# Patient Record
Sex: Male | Born: 1980 | Race: Black or African American | Hispanic: No | Smoking: Current every day smoker
Health system: Southern US, Community
[De-identification: ages and names within clinical notes are randomized; demographics above are authoritative.]

## PROBLEM LIST (undated history)

## (undated) DIAGNOSIS — R569 Unspecified convulsions: Secondary | ICD-10-CM

## (undated) DIAGNOSIS — I10 Essential (primary) hypertension: Secondary | ICD-10-CM

---

## 2017-02-02 DIAGNOSIS — E119 Type 2 diabetes mellitus without complications: Secondary | ICD-10-CM

## 2017-02-02 HISTORY — DX: Type 2 diabetes mellitus without complications: E11.9

## 2020-06-16 ENCOUNTER — Encounter (HOSPITAL_COMMUNITY): Payer: Self-pay | Admitting: Emergency Medicine

## 2020-06-16 ENCOUNTER — Emergency Department (HOSPITAL_COMMUNITY): Payer: Self-pay

## 2020-06-16 ENCOUNTER — Other Ambulatory Visit: Payer: Self-pay

## 2020-06-16 ENCOUNTER — Emergency Department (HOSPITAL_COMMUNITY)
Admission: EM | Admit: 2020-06-16 | Discharge: 2020-06-17 | Disposition: A | Discharge status: Home / Self Care | Payer: Self-pay | Attending: Emergency Medicine | Admitting: Emergency Medicine

## 2020-06-16 DIAGNOSIS — F17210 Nicotine dependence, cigarettes, uncomplicated: Secondary | ICD-10-CM | POA: Insufficient documentation

## 2020-06-16 DIAGNOSIS — R0789 Other chest pain: Secondary | ICD-10-CM | POA: Insufficient documentation

## 2020-06-16 DIAGNOSIS — R079 Chest pain, unspecified: Secondary | ICD-10-CM

## 2020-06-16 NOTE — ED Triage Notes (Signed)
Patient arrives complaining of left chest pain that began 2 days ago. Patient states it feels like a cramping. No N/V. Denies other symptoms.

## 2020-06-17 LAB — CBC
HCT: 52.4 % — ABNORMAL HIGH (ref 39.0–52.0)
Hemoglobin: 17.6 g/dL — ABNORMAL HIGH (ref 13.0–17.0)
MCH: 30.7 pg (ref 26.0–34.0)
MCHC: 33.6 g/dL (ref 30.0–36.0)
MCV: 91.4 fL (ref 80.0–100.0)
Platelets: 235 10*3/uL (ref 150–400)
RBC: 5.73 MIL/uL (ref 4.22–5.81)
RDW: 13.6 % (ref 11.5–15.5)
WBC: 9.4 10*3/uL (ref 4.0–10.5)
nRBC: 0 % (ref 0.0–0.2)

## 2020-06-17 LAB — BASIC METABOLIC PANEL
Anion gap: 5 (ref 5–15)
BUN: 14 mg/dL (ref 6–20)
CO2: 28 mmol/L (ref 22–32)
Calcium: 9.2 mg/dL (ref 8.9–10.3)
Chloride: 106 mmol/L (ref 98–111)
Creatinine, Ser: 1.15 mg/dL (ref 0.61–1.24)
GFR, Estimated: >60 mL/min (ref >60)
Glucose, Bld: 114 mg/dL — ABNORMAL HIGH (ref 70–99)
Potassium: 4.1 mmol/L (ref 3.5–5.1)
Sodium: 139 mmol/L (ref 135–145)

## 2020-06-17 LAB — D-DIMER, QUANTITATIVE: D-Dimer, Quant: 0.38 ug/mL-FEU (ref 0.00–0.50)

## 2020-06-17 LAB — TROPONIN I (HIGH SENSITIVITY)
Troponin I (High Sensitivity): 2 ng/L (ref <18)
Troponin I (High Sensitivity): 2 ng/L (ref <18)

## 2020-06-17 MED ORDER — IBUPROFEN 800 MG PO TABS: 800.0000 mg | ORAL_TABLET | Freq: Three times a day (TID) | ORAL | 0 refills | Status: DC

## 2020-06-17 MED ORDER — FENTANYL CITRATE (PF) 100 MCG/2ML IJ SOLN
50.0000 ug | Freq: Once | INTRAMUSCULAR | Status: AC
Start: 2020-06-17 — End: 2020-06-17
  Administered 2020-06-17: 50 ug via INTRAVENOUS
  Filled 2020-06-17: qty 2

## 2020-06-17 MED ORDER — METHOCARBAMOL 500 MG PO TABS: 500.0000 mg | ORAL_TABLET | Freq: Three times a day (TID) | ORAL | 0 refills | Status: AC | PRN

## 2020-06-17 NOTE — ED Provider Notes (Signed)
[facility]-EMERGENCY DEPT Provider Note   CSN: [id number] Arrival date & time: 06/16/20  2302     History Chief Complaint  Patient presents with  . Chest Pain    Jonathon Bird is a 40 y.o. male.  Patient presents to the emergency department for evaluation of chest pain.  Experiencing pain for 2 days.  He reports that it feels like there is a cramp in his chest.  Patient reports that the area of pain moves around.  Sometimes its not even in the chest, occasionally he is cramping in his legs or neck.  He is not short of breath but he does feel like pain worsens when he breathes.  No associated nausea, vomiting, diarrhea, fever or cough.        History reviewed. No pertinent past medical history.  There are no problems to display for this patient.   History reviewed. No pertinent surgical history.     No family history on file.  Social History   Tobacco Use  . Smoking status: Current Every Day Smoker    Packs/day: 0.50    Types: Cigarettes  . Smokeless tobacco: Never Used  Substance Use Topics  . Alcohol use: Yes    Alcohol/week: 21.0 standard drinks    Types: 21 Cans of beer per week    Comment: Patient states 3 beers a day, visitor states it is occassional  . Drug use: Not Currently    Home Medications Prior to Admission medications   Medication Sig Start Date End Date Taking? Authorizing Provider  ibuprofen (ADVIL) 800 MG tablet Take 1 tablet (800 mg total) by mouth 3 (three) times daily. 06/17/20  Yes Jeanette Moffatt, Canary Brim, MD  methocarbamol (ROBAXIN) 500 MG tablet Take 1 tablet (500 mg total) by mouth every 8 (eight) hours as needed for muscle spasms. 06/17/20  Yes Davier Tramell, Canary Brim, MD    Allergies    Patient has no known allergies.  Review of Systems   Review of Systems  Cardiovascular: Positive for chest pain.  All other systems reviewed and are negative.   Physical Exam Updated Vital Signs BP 122/79   Pulse 62    Temp 97.6 F (36.4 C) (Oral)   Resp 10   Ht 5\' 7"  (1.702 m)   Wt 81.6 kg   SpO2 100%   BMI 28.19 kg/m   Physical Exam Vitals and nursing note reviewed.  Constitutional:      General: He is not in acute distress.    Appearance: Normal appearance. He is well-developed.  HENT:     Head: Normocephalic and atraumatic.     Right Ear: Hearing normal.     Left Ear: Hearing normal.     Nose: Nose normal.  Eyes:     Conjunctiva/sclera: Conjunctivae normal.     Pupils: Pupils are equal, round, and reactive to light.  Cardiovascular:     Rate and Rhythm: Regular rhythm.     Heart sounds: S1 normal and S2 normal. No murmur heard. No friction rub. No gallop.   Pulmonary:     Effort: Pulmonary effort is normal. No respiratory distress.     Breath sounds: Normal breath sounds.  Chest:     Chest wall: No tenderness.  Abdominal:     General: Bowel sounds are normal.     Palpations: Abdomen is soft.     Tenderness: There is no abdominal tenderness. There is no guarding or rebound. Negative signs include Murphy's sign and McBurney's sign.  Hernia: No hernia is present.  Musculoskeletal:        General: Normal range of motion.     Cervical back: Normal range of motion and neck supple.  Skin:    General: Skin is warm and dry.     Findings: No rash.  Neurological:     Mental Status: He is alert and oriented to person, place, and time.     GCS: GCS eye subscore is 4. GCS verbal subscore is 5. GCS motor subscore is 6.     Cranial Nerves: No cranial nerve deficit.     Sensory: No sensory deficit.     Coordination: Coordination normal.  Psychiatric:        Speech: Speech normal.        Behavior: Behavior normal.        Thought Content: Thought content normal.     ED Results / Procedures / Treatments   Labs (all labs ordered are listed, but only abnormal results are displayed) Labs Reviewed  BASIC METABOLIC PANEL - Abnormal; Notable for the following components:      Result Value    Glucose, Bld 114 (*)    All other components within normal limits  CBC - Abnormal; Notable for the following components:   Hemoglobin 17.6 (*)    HCT 52.4 (*)    All other components within normal limits  D-DIMER, QUANTITATIVE  TROPONIN I (HIGH SENSITIVITY)  TROPONIN I (HIGH SENSITIVITY)    EKG None  Radiology DG Chest Port 1 View  Result Date: 06/17/2020 CLINICAL DATA:  Two days of left chest pain EXAM: PORTABLE CHEST 1 VIEW COMPARISON:  None. FINDINGS: The heart size and mediastinal contours are within normal limits. Perihilar predominant bronchial wall thickening. No focal consolidation. No visible pleural effusion or pneumothorax. The visualized skeletal structures are unremarkable. IMPRESSION: Perihilar predominant bronchial wall thickening which could reflect bronchiolitis or reactive airways disease. No focal consolidation. Electronically Signed   By: Maudry Mayhew MD   On: 06/17/2020 02:13    Procedures Procedures   Medications Ordered in ED Medications  fentaNYL (SUBLIMAZE) injection 50 mcg (50 mcg Intravenous Given 06/17/20 4098)    ED Course  I have reviewed the triage vital signs and the nursing notes.  Pertinent labs & imaging results that were available during my care of the patient were reviewed by me and considered in my medical decision making (see chart for details).    MDM Rules/Calculators/A&P                          Patient presents to the emergency department for evaluation of chest pain.  Symptoms ongoing for a couple of days.  He reports that as feeling like a cramp or a spasm.  The area of pain moves around to different areas of the chest and sometimes other parts of the body.  Work-up has been very reassuring.  Symptoms are very atypical for cardiac etiology.  Troponins are negative.  D-dimer is negative.  As he is low risk for PE per Wells criteria, no further work-up needed.  He is also felt to be very low risk for cardiac etiology and does not  require inpatient work-up.  Treat for likely musculoskeletal pain.  Final Clinical Impression(s) / ED Diagnoses Final diagnoses:  Atypical chest pain    Rx / DC Orders ED Discharge Orders         Ordered    ibuprofen (ADVIL) 800 MG tablet  3 times daily        06/17/20 0424    methocarbamol (ROBAXIN) 500 MG tablet  Every 8 hours PRN        06/17/20 0424           Gilda Crease, MD 06/17/20 [phone]

## 2020-12-29 ENCOUNTER — Encounter (HOSPITAL_COMMUNITY): Payer: Self-pay

## 2020-12-29 ENCOUNTER — Emergency Department (HOSPITAL_COMMUNITY): Payer: No Typology Code available for payment source

## 2020-12-29 ENCOUNTER — Inpatient Hospital Stay (HOSPITAL_COMMUNITY)
Admission: EM | Admit: 2020-12-29 | Discharge: 2021-01-01 | DRG: 101 | Disposition: A | Discharge status: Home / Self Care | Payer: No Typology Code available for payment source | Attending: Surgery | Admitting: Surgery

## 2020-12-29 ENCOUNTER — Other Ambulatory Visit: Payer: Self-pay

## 2020-12-29 DIAGNOSIS — G40409 Other generalized epilepsy and epileptic syndromes, not intractable, without status epilepticus: Secondary | ICD-10-CM | POA: Diagnosis present

## 2020-12-29 DIAGNOSIS — Z820 Family history of epilepsy and other diseases of the nervous system: Secondary | ICD-10-CM | POA: Diagnosis not present

## 2020-12-29 DIAGNOSIS — Y92410 Unspecified street and highway as the place of occurrence of the external cause: Secondary | ICD-10-CM | POA: Diagnosis not present

## 2020-12-29 DIAGNOSIS — R0789 Other chest pain: Secondary | ICD-10-CM | POA: Diagnosis not present

## 2020-12-29 DIAGNOSIS — F17210 Nicotine dependence, cigarettes, uncomplicated: Secondary | ICD-10-CM | POA: Diagnosis present

## 2020-12-29 DIAGNOSIS — E860 Dehydration: Secondary | ICD-10-CM | POA: Diagnosis present

## 2020-12-29 DIAGNOSIS — M545 Low back pain, unspecified: Secondary | ICD-10-CM | POA: Diagnosis present

## 2020-12-29 DIAGNOSIS — S60812A Abrasion of left wrist, initial encounter: Secondary | ICD-10-CM | POA: Diagnosis present

## 2020-12-29 DIAGNOSIS — K5900 Constipation, unspecified: Secondary | ICD-10-CM | POA: Diagnosis not present

## 2020-12-29 DIAGNOSIS — R451 Restlessness and agitation: Secondary | ICD-10-CM | POA: Diagnosis present

## 2020-12-29 DIAGNOSIS — R4182 Altered mental status, unspecified: Secondary | ICD-10-CM | POA: Diagnosis present

## 2020-12-29 DIAGNOSIS — F10120 Alcohol abuse with intoxication, uncomplicated: Secondary | ICD-10-CM | POA: Diagnosis present

## 2020-12-29 DIAGNOSIS — E119 Type 2 diabetes mellitus without complications: Secondary | ICD-10-CM | POA: Diagnosis present

## 2020-12-29 DIAGNOSIS — S022XXA Fracture of nasal bones, initial encounter for closed fracture: Secondary | ICD-10-CM | POA: Diagnosis present

## 2020-12-29 DIAGNOSIS — Z20822 Contact with and (suspected) exposure to covid-19: Secondary | ICD-10-CM | POA: Diagnosis present

## 2020-12-29 DIAGNOSIS — S90511A Abrasion, right ankle, initial encounter: Secondary | ICD-10-CM | POA: Diagnosis present

## 2020-12-29 DIAGNOSIS — Y906 Blood alcohol level of 120-199 mg/100 ml: Secondary | ICD-10-CM | POA: Diagnosis present

## 2020-12-29 DIAGNOSIS — S060XAA Concussion with loss of consciousness status unknown, initial encounter: Secondary | ICD-10-CM | POA: Diagnosis present

## 2020-12-29 DIAGNOSIS — M542 Cervicalgia: Secondary | ICD-10-CM | POA: Diagnosis present

## 2020-12-29 DIAGNOSIS — R569 Unspecified convulsions: Secondary | ICD-10-CM | POA: Diagnosis present

## 2020-12-29 DIAGNOSIS — T1490XA Injury, unspecified, initial encounter: Secondary | ICD-10-CM

## 2020-12-29 LAB — COMPREHENSIVE METABOLIC PANEL
ALT: 15 U/L (ref 0–44)
AST: 19 U/L (ref 15–41)
Albumin: 3.8 g/dL (ref 3.5–5.0)
Alkaline Phosphatase: 43 U/L (ref 38–126)
Anion gap: 8 (ref 5–15)
BUN: 8 mg/dL (ref 6–20)
CO2: 24 mmol/L (ref 22–32)
Calcium: 8.5 mg/dL — ABNORMAL LOW (ref 8.9–10.3)
Chloride: 106 mmol/L (ref 98–111)
Creatinine, Ser: 1.04 mg/dL (ref 0.61–1.24)
GFR, Estimated: >60 mL/min (ref >60)
Glucose, Bld: 92 mg/dL (ref 70–99)
Potassium: 3.7 mmol/L (ref 3.5–5.1)
Sodium: 138 mmol/L (ref 135–145)
Total Bilirubin: 0.3 mg/dL (ref 0.3–1.2)
Total Protein: 6.9 g/dL (ref 6.5–8.1)

## 2020-12-29 LAB — I-STAT ARTERIAL BLOOD GAS, ED
Acid-base deficit: 4 mmol/L — ABNORMAL HIGH (ref 0.0–2.0)
Bicarbonate: 24.1 mmol/L (ref 20.0–28.0)
Calcium, Ion: 1.15 mmol/L (ref 1.15–1.40)
HCT: 46 % (ref 39.0–52.0)
Hemoglobin: 15.6 g/dL (ref 13.0–17.0)
O2 Saturation: 100 %
Patient temperature: 98.6
Potassium: 3.6 mmol/L (ref 3.5–5.1)
Sodium: 141 mmol/L (ref 135–145)
TCO2: 26 mmol/L (ref 22–32)
pCO2 arterial: 52.7 mmHg — ABNORMAL HIGH (ref 32.0–48.0)
pH, Arterial: 7.268 — ABNORMAL LOW (ref 7.350–7.450)
pO2, Arterial: 488 mmHg — ABNORMAL HIGH (ref 83.0–108.0)

## 2020-12-29 LAB — PROTIME-INR
INR: 1 (ref 0.8–1.2)
Prothrombin Time: 13.6 seconds (ref 11.4–15.2)

## 2020-12-29 LAB — BASIC METABOLIC PANEL
Anion gap: 11 (ref 5–15)
BUN: 8 mg/dL (ref 6–20)
CO2: 21 mmol/L — ABNORMAL LOW (ref 22–32)
Calcium: 8.1 mg/dL — ABNORMAL LOW (ref 8.9–10.3)
Chloride: 104 mmol/L (ref 98–111)
Creatinine, Ser: 0.8 mg/dL (ref 0.61–1.24)
GFR, Estimated: >60 mL/min (ref >60)
Glucose, Bld: 95 mg/dL (ref 70–99)
Potassium: 3.5 mmol/L (ref 3.5–5.1)
Sodium: 136 mmol/L (ref 135–145)

## 2020-12-29 LAB — LACTIC ACID, PLASMA
Lactic Acid, Venous: 3.1 mmol/L (ref 0.5–1.9)
Lactic Acid, Venous: 2.3 mmol/L (ref 0.5–1.9)

## 2020-12-29 LAB — HIV ANTIBODY (ROUTINE TESTING W REFLEX): HIV Screen 4th Generation wRfx: NONREACTIVE

## 2020-12-29 LAB — CBC
HCT: 47.6 % (ref 39.0–52.0)
Hemoglobin: 15.6 g/dL (ref 13.0–17.0)
MCH: 30.5 pg (ref 26.0–34.0)
MCHC: 32.8 g/dL (ref 30.0–36.0)
MCV: 93 fL (ref 80.0–100.0)
Platelets: 270 10*3/uL (ref 150–400)
RBC: 5.12 MIL/uL (ref 4.22–5.81)
RDW: 13.6 % (ref 11.5–15.5)
WBC: 8.5 10*3/uL (ref 4.0–10.5)
nRBC: 0 % (ref 0.0–0.2)

## 2020-12-29 LAB — I-STAT CHEM 8, ED
BUN: 9 mg/dL (ref 6–20)
Calcium, Ion: 1.13 mmol/L — ABNORMAL LOW (ref 1.15–1.40)
Chloride: 105 mmol/L (ref 98–111)
Creatinine, Ser: 1.2 mg/dL (ref 0.61–1.24)
Glucose, Bld: 86 mg/dL (ref 70–99)
HCT: 49 % (ref 39.0–52.0)
Hemoglobin: 16.7 g/dL (ref 13.0–17.0)
Potassium: 3.7 mmol/L (ref 3.5–5.1)
Sodium: 141 mmol/L (ref 135–145)
TCO2: 24 mmol/L (ref 22–32)

## 2020-12-29 LAB — GLUCOSE, CAPILLARY: Glucose-Capillary: 163 mg/dL — ABNORMAL HIGH (ref 70–99)

## 2020-12-29 LAB — ETHANOL: Alcohol, Ethyl (B): 143 mg/dL — ABNORMAL HIGH (ref <10)

## 2020-12-29 LAB — SAMPLE TO BLOOD BANK

## 2020-12-29 LAB — RESP PANEL BY RT-PCR (FLU A&B, COVID) ARPGX2
Influenza A by PCR: NEGATIVE
Influenza B by PCR: NEGATIVE
SARS Coronavirus 2 by RT PCR: NEGATIVE

## 2020-12-29 MED ORDER — LORAZEPAM 2 MG/ML IJ SOLN
INTRAMUSCULAR | Status: AC
  Filled 2020-12-29: qty 4

## 2020-12-29 MED ORDER — INSULIN ASPART 100 UNIT/ML IJ SOLN: 0.0000 [IU] | Freq: Every day | INTRAMUSCULAR | Status: DC

## 2020-12-29 MED ORDER — PROPOFOL 1000 MG/100ML IV EMUL: 5.0000 ug/kg/min | INTRAVENOUS | Status: DC

## 2020-12-29 MED ORDER — ONDANSETRON HCL 4 MG PO TABS: 4.0000 mg | ORAL_TABLET | Freq: Four times a day (QID) | ORAL | Status: DC | PRN

## 2020-12-29 MED ORDER — LEVETIRACETAM IN NACL 500 MG/100ML IV SOLN
500.0000 mg | Freq: Two times a day (BID) | INTRAVENOUS | Status: DC
  Administered 2020-12-29 – 2020-12-30 (×3): 500 mg via INTRAVENOUS
  Filled 2020-12-29 (×4): qty 100

## 2020-12-29 MED ORDER — PROPOFOL 1000 MG/100ML IV EMUL: 0.0000 ug/kg/min | INTRAVENOUS | Status: DC

## 2020-12-29 MED ORDER — IOPAMIDOL (ISOVUE-370) INJECTION 76%
75.0000 mL | Freq: Once | INTRAVENOUS | Status: AC | PRN
  Administered 2020-12-29: 75 mL via INTRAVENOUS

## 2020-12-29 MED ORDER — SIMETHICONE 80 MG PO CHEW: 80.0000 mg | CHEWABLE_TABLET | Freq: Four times a day (QID) | ORAL | Status: DC | PRN

## 2020-12-29 MED ORDER — DEXTROSE-NACL 5-0.45 % IV SOLN: INTRAVENOUS | Status: DC

## 2020-12-29 MED ORDER — ONDANSETRON HCL 4 MG/2ML IJ SOLN
4.0000 mg | Freq: Four times a day (QID) | INTRAMUSCULAR | Status: DC | PRN
  Administered 2020-12-29: 4 mg via INTRAVENOUS
  Filled 2020-12-29: qty 2

## 2020-12-29 MED ORDER — KETOROLAC TROMETHAMINE 15 MG/ML IJ SOLN
15.0000 mg | Freq: Three times a day (TID) | INTRAMUSCULAR | Status: DC
  Administered 2020-12-29 – 2021-01-01 (×8): 15 mg via INTRAVENOUS
  Filled 2020-12-29 (×11): qty 1

## 2020-12-29 MED ORDER — DOCUSATE SODIUM 100 MG PO CAPS
100.0000 mg | ORAL_CAPSULE | Freq: Two times a day (BID) | ORAL | Status: DC
  Administered 2020-12-29 – 2020-12-31 (×5): 100 mg via ORAL
  Filled 2020-12-29 (×8): qty 1

## 2020-12-29 MED ORDER — DOCUSATE SODIUM 50 MG/5ML PO LIQD: 100.0000 mg | Freq: Two times a day (BID) | ORAL | Status: DC

## 2020-12-29 MED ORDER — ONDANSETRON HCL 4 MG/2ML IJ SOLN: 4.0000 mg | Freq: Four times a day (QID) | INTRAMUSCULAR | Status: DC | PRN

## 2020-12-29 MED ORDER — PROCHLORPERAZINE EDISYLATE 10 MG/2ML IJ SOLN: 10.0000 mg | INTRAMUSCULAR | Status: DC | PRN

## 2020-12-29 MED ORDER — ENOXAPARIN SODIUM 30 MG/0.3ML IJ SOSY
30.0000 mg | PREFILLED_SYRINGE | Freq: Two times a day (BID) | INTRAMUSCULAR | Status: DC
  Administered 2020-12-30 – 2020-12-31 (×4): 30 mg via SUBCUTANEOUS
  Filled 2020-12-29 (×6): qty 0.3

## 2020-12-29 MED ORDER — ACETAMINOPHEN 325 MG PO TABS
650.0000 mg | ORAL_TABLET | Freq: Four times a day (QID) | ORAL | Status: DC
  Administered 2020-12-29 – 2021-01-01 (×12): 650 mg via ORAL
  Filled 2020-12-29 (×13): qty 2

## 2020-12-29 MED ORDER — INSULIN ASPART 100 UNIT/ML IJ SOLN: 0.0000 [IU] | Freq: Three times a day (TID) | INTRAMUSCULAR | Status: DC

## 2020-12-29 MED ORDER — POLYETHYLENE GLYCOL 3350 17 G PO PACK
17.0000 g | PACK | Freq: Every day | ORAL | Status: DC
  Filled 2020-12-29: qty 1

## 2020-12-29 MED ORDER — OXYCODONE HCL 5 MG/5ML PO SOLN: 5.0000 mg | ORAL | Status: DC | PRN

## 2020-12-29 MED ORDER — INFLUENZA VAC SPLIT QUAD 0.5 ML IM SUSY
0.5000 mL | PREFILLED_SYRINGE | INTRAMUSCULAR | Status: DC
  Filled 2020-12-29 (×2): qty 0.5

## 2020-12-29 MED ORDER — FENTANYL 2500MCG IN NS 250ML (10MCG/ML) PREMIX INFUSION
50.0000 ug/h | INTRAVENOUS | Status: DC
Start: 2020-12-29 — End: 2020-12-29
  Administered 2020-12-29: 50 ug/h via INTRAVENOUS
  Filled 2020-12-29: qty 250

## 2020-12-29 MED ORDER — ACETAMINOPHEN 160 MG/5ML PO SOLN: 650.0000 mg | ORAL | Status: DC | PRN

## 2020-12-29 MED ORDER — MORPHINE SULFATE (PF) 2 MG/ML IV SOLN
2.0000 mg | INTRAVENOUS | Status: DC | PRN
  Administered 2020-12-29 (×3): 2 mg via INTRAVENOUS
  Filled 2020-12-29: qty 2
  Filled 2020-12-29 (×2): qty 1

## 2020-12-29 MED ORDER — METOPROLOL TARTRATE 5 MG/5ML IV SOLN: 5.0000 mg | Freq: Four times a day (QID) | INTRAVENOUS | Status: DC | PRN

## 2020-12-29 MED ORDER — FENTANYL CITRATE PF 50 MCG/ML IJ SOSY
50.0000 ug | PREFILLED_SYRINGE | Freq: Once | INTRAMUSCULAR | Status: AC
  Administered 2020-12-29: 50 ug via INTRAVENOUS

## 2020-12-29 MED ORDER — FENTANYL BOLUS VIA INFUSION
50.0000 ug | INTRAVENOUS | Status: DC | PRN
  Filled 2020-12-29: qty 100

## 2020-12-29 MED ORDER — PROPOFOL 1000 MG/100ML IV EMUL
INTRAVENOUS | Status: AC
  Administered 2020-12-29: 14 ug/kg/min via INTRAVENOUS
  Filled 2020-12-29: qty 100

## 2020-12-29 NOTE — ED Notes (Signed)
Prop drip hung 46mcg/min by autumn rn
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
## 2020-12-29 NOTE — ED Notes (Signed)
Ewtomidate  20 mg   roc  100mg  iv per autumn rn

## 2020-12-29 NOTE — ED Notes (Signed)
Bp 110/90 man
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
## 2020-12-29 NOTE — ED Notes (Signed)
All sedation stopped. Plan to extubate due to pt breathing around tube and copious secretions. Pt able to follow commands

## 2020-12-29 NOTE — Progress Notes (Signed)
Inpatient Rehab Admissions Coordinator:   Per therapy recommendations, patient was screened for CIR candidacy by Megan Salon, MS, CCC-SLP. At this time, Pt. does not appear to demonstrate medical necessity to justify in hospital rehabilitation/CIR. will not pursue a rehab consult for this Pt. I have reviewed case with rehab MD and she is in agreement.   Recommend other rehab venues to be pursued.  Please contact me with any questions.   Megan Salon, MS, CCC-SLP Rehab Admissions Coordinator  [phone] (celll) [phone] (office)

## 2020-12-29 NOTE — ED Notes (Signed)
Pt returned from ct

## 2020-12-29 NOTE — ED Notes (Signed)
Visitors at bedside.

## 2020-12-29 NOTE — Progress Notes (Signed)
RT Note: ETT retracted 3 cm per CXR and MD. Tube secured at 25@ lip

## 2020-12-29 NOTE — Progress Notes (Signed)
Transported to CT and back to TRAB w/o complication. RT will cont to monitor.
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
## 2020-12-29 NOTE — ED Notes (Signed)
Autumn rn to c-t with the pt

## 2020-12-29 NOTE — ED Notes (Signed)
Dr. Pilar Plate called to bedside. Verbal order for soft restraints placed

## 2020-12-29 NOTE — ED Notes (Signed)
[place] j c-collar placed by autumn rn  number 18 og

## 2020-12-29 NOTE — Progress Notes (Signed)
Inpatient Rehab Admissions Coordinator:   Per therapy recommendations,  patient was screened for CIR candidacy by Megan Salon, MS, CCC-SLP. I have requested MD review case for medical necessity for CIR admission and will update chart when I receive a response.   Megan Salon, MS, CCC-SLP Rehab Admissions Coordinator  [phone] (celll) [phone] (office)

## 2020-12-29 NOTE — Progress Notes (Signed)
Pt. Tube appeared to be malfunctioning. MD was notified and responded to pt. Room. MD stated to extubate pt. Pt. Was extubated and is tolerating well on a nasal cannula. No issues at this time.
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
## 2020-12-29 NOTE — ED Notes (Signed)
Height 71 inches wt 83.9kg

## 2020-12-29 NOTE — ED Notes (Signed)
To ct
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
## 2020-12-29 NOTE — ED Notes (Signed)
Intubated by dr Pilar Plate  25 at   the  teeth
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
## 2020-12-29 NOTE — ED Notes (Signed)
Receiving RN updated on pt extubation

## 2020-12-30 ENCOUNTER — Inpatient Hospital Stay (HOSPITAL_COMMUNITY): Payer: No Typology Code available for payment source

## 2020-12-30 DIAGNOSIS — R569 Unspecified convulsions: Secondary | ICD-10-CM

## 2020-12-30 LAB — BLOOD GAS, ARTERIAL
Acid-Base Excess: 1.1 mmol/L (ref 0.0–2.0)
Bicarbonate: 25.3 mmol/L (ref 20.0–28.0)
Drawn by: 56037
FIO2: 28
O2 Saturation: 98.5 %
Patient temperature: 37
pCO2 arterial: 40.6 mmHg (ref 32.0–48.0)
pH, Arterial: 7.411 (ref 7.350–7.450)
pO2, Arterial: 118 mmHg — ABNORMAL HIGH (ref 83.0–108.0)

## 2020-12-30 LAB — HEMOGLOBIN A1C
Hgb A1c MFr Bld: 5.4 % (ref 4.8–5.6)
Mean Plasma Glucose: 108 mg/dL

## 2020-12-30 LAB — GLUCOSE, CAPILLARY
Glucose-Capillary: 91 mg/dL (ref 70–99)
Glucose-Capillary: 112 mg/dL — ABNORMAL HIGH (ref 70–99)

## 2020-12-30 LAB — TRIGLYCERIDES: Triglycerides: 54 mg/dL (ref <150)

## 2020-12-30 MED ORDER — PRAZOSIN HCL 1 MG PO CAPS
1.0000 mg | ORAL_CAPSULE | Freq: Every day | ORAL | Status: DC
  Administered 2020-12-31: 1 mg via ORAL
  Filled 2020-12-30 (×2): qty 1

## 2020-12-30 MED ORDER — VALPROATE SODIUM 100 MG/ML IV SOLN
1000.0000 mg | Freq: Once | INTRAVENOUS | Status: AC
  Administered 2020-12-31: 1000 mg via INTRAVENOUS
  Filled 2020-12-30: qty 10

## 2020-12-30 MED ORDER — POLYETHYLENE GLYCOL 3350 17 G PO PACK
17.0000 g | PACK | Freq: Every day | ORAL | Status: DC
  Administered 2020-12-30: 17 g via ORAL
  Filled 2020-12-30 (×2): qty 1

## 2020-12-30 MED ORDER — PRAZOSIN HCL 2 MG PO CAPS: 3.0000 mg | ORAL_CAPSULE | Freq: Every evening | ORAL | Status: DC | PRN

## 2020-12-30 MED ORDER — OXYCODONE HCL 5 MG PO TABS
5.0000 mg | ORAL_TABLET | Freq: Four times a day (QID) | ORAL | Status: DC | PRN
  Administered 2020-12-30 – 2020-12-31 (×2): 5 mg via ORAL
  Filled 2020-12-30 (×3): qty 1

## 2020-12-30 MED ORDER — DIVALPROEX SODIUM 500 MG PO DR TAB
500.0000 mg | DELAYED_RELEASE_TABLET | Freq: Two times a day (BID) | ORAL | Status: DC
  Administered 2020-12-31 – 2021-01-01 (×3): 500 mg via ORAL
  Filled 2020-12-30 (×4): qty 1

## 2020-12-30 MED ORDER — HYDROXYZINE HCL 10 MG PO TABS
10.0000 mg | ORAL_TABLET | Freq: Three times a day (TID) | ORAL | Status: DC | PRN
  Administered 2020-12-30: 10 mg via ORAL
  Filled 2020-12-30 (×2): qty 1

## 2020-12-30 MED ORDER — GABAPENTIN 300 MG PO CAPS
300.0000 mg | ORAL_CAPSULE | Freq: Three times a day (TID) | ORAL | Status: DC
  Administered 2020-12-30 – 2021-01-01 (×6): 300 mg via ORAL
  Filled 2020-12-30 (×8): qty 1

## 2020-12-30 MED ORDER — LEVETIRACETAM 500 MG PO TABS
500.0000 mg | ORAL_TABLET | Freq: Two times a day (BID) | ORAL | Status: DC
  Administered 2020-12-30: 500 mg via ORAL
  Filled 2020-12-30 (×3): qty 1

## 2020-12-30 MED ORDER — METHOCARBAMOL 500 MG PO TABS
500.0000 mg | ORAL_TABLET | Freq: Three times a day (TID) | ORAL | Status: DC
  Administered 2020-12-30 – 2021-01-01 (×7): 500 mg via ORAL
  Filled 2020-12-30 (×8): qty 1

## 2020-12-30 MED ORDER — NICOTINE 14 MG/24HR TD PT24
14.0000 mg | MEDICATED_PATCH | Freq: Every day | TRANSDERMAL | Status: DC
  Administered 2020-12-30 – 2020-12-31 (×2): 14 mg via TRANSDERMAL
  Filled 2020-12-30 (×3): qty 1

## 2020-12-30 MED ORDER — LEVETIRACETAM IN NACL 500 MG/100ML IV SOLN
500.0000 mg | Freq: Two times a day (BID) | INTRAVENOUS | Status: DC
  Administered 2020-12-30: 500 mg via INTRAVENOUS
  Filled 2020-12-30: qty 100

## 2020-12-30 NOTE — Progress Notes (Signed)
Notified of witnessed sz-like activity while bedside nurse present and with patient simultaneously reporting chest pain. Episode spontaneously resolved without medication. Vitals remained normal. EKG obtained, NSR. Lytes, CBC, ABG ordered. Reported, unconfirmed, h/o sz, not on medication, reports last sz 57m ago. On keppra since admission, changed to IV dose given this episode. With verbal communication by the patient during what is being reported to me as a seizure, my suspicion for organic etiology is low, however with nasal bone fx and reported sz in the field that resulted in the patient getting intubated, I did consult Neurology, Dr. Wilford Corner, who will plan to obtain EEG. Will f/u on labs.  Diamantina Monks, MD General and Trauma Surgery [facility] Surgery
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
## 2020-12-30 NOTE — Significant Event (Signed)
Rapid Response Event Note   Reason for Call : CP post seizure Initial Focused Assessment:  Nursing notified me of patient having a short seizure (1 min) and then c/o CP after seizure. I did not witness seizure activity. Pt arousable, but "sleepy" since seizure. Vague CP symptoms. EKG ordered to r/o STE. 12 lead NSR with no STE. Pt on Keppra for seizures already.   2120-98.29F, HR 65, 133/91, RR 18 with sats 100% on 2L [place]   Interventions:  -12 lead EKG   Dr. Bedelia Person was notified by primary RN and orders to follow.    MD Notified: Per Primary RN Call Time: 2125 Arrival Time: 2130 End Time: 2139  Rose Fillers, RN

## 2020-12-30 NOTE — Discharge Instructions (Addendum)
SEIZURE PRECAUTIONS °Per [place] DMV statutes, patients with seizures are not allowed to drive until they have been seizure-free for six months. °  °Use caution when using heavy equipment or power tools. Avoid working on ladders or at heights. Take showers instead of baths. Ensure the water temperature is not too high on the home water heater. Do not go swimming alone. Do not lock yourself in a room alone (i.e. bathroom). When caring for infants or small children, sit down when holding, feeding, or changing them to minimize risk of injury to the child in the event you have a seizure. Maintain good sleep hygiene. Avoid alcohol. °  °If patient has another seizure, call 911 and bring them back to the ED if: °A.  The seizure lasts longer than 5 minutes.      °B.  The patient doesn't wake shortly after the seizure or has new problems such as difficulty seeing, speaking or moving following the seizure °C.  The patient was injured during the seizure °D.  The patient has a temperature over 102 F (39C) °E.  The patient vomited during the seizure and now is having trouble breathing ° ° °

## 2020-12-30 NOTE — Progress Notes (Signed)
Mobility Specialist Progress Note   12/30/20 1826  Mobility  Activity Ambulated in hall  Level of Assistance Contact guard assist, steadying assist  Assistive Device Front wheel walker  Distance Ambulated (ft) 175 ft  Mobility Ambulated with assistance in hallway;Ambulated with assistance in room  Mobility Response Tolerated well  Mobility performed by Mobility specialist  $Mobility charge 1 Mobility   Received pt EOB c/o tightness in LE but no pain and agreeable to mobility. Pt tends to walk w/ locked legs(no knee extension) but had no complaint of pain. X1 seated break d/t fatigue but recovered quickly. While seated pt worked on some passive ROM in LE. Returned back to room where pt practice ambulating w/o AD which they tolerated well for ~10 ft w/ no LOB. Left w/ pt on EOB w/ call bell by side and significant other in room.      Frederico Hamman Mobility Specialist Phone Number [phone]
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
## 2020-12-31 ENCOUNTER — Inpatient Hospital Stay (HOSPITAL_COMMUNITY): Payer: No Typology Code available for payment source

## 2020-12-31 LAB — BASIC METABOLIC PANEL
Anion gap: 5 (ref 5–15)
BUN: 12 mg/dL (ref 6–20)
CO2: 26 mmol/L (ref 22–32)
Calcium: 8.3 mg/dL — ABNORMAL LOW (ref 8.9–10.3)
Chloride: 106 mmol/L (ref 98–111)
Creatinine, Ser: 1.06 mg/dL (ref 0.61–1.24)
GFR, Estimated: >60 mL/min (ref >60)
Glucose, Bld: 91 mg/dL (ref 70–99)
Potassium: 3.7 mmol/L (ref 3.5–5.1)
Sodium: 137 mmol/L (ref 135–145)

## 2020-12-31 LAB — MAGNESIUM: Magnesium: 2.1 mg/dL (ref 1.7–2.4)

## 2020-12-31 LAB — PHOSPHORUS: Phosphorus: 3.5 mg/dL (ref 2.5–4.6)

## 2020-12-31 LAB — CBC
HCT: 42.9 % (ref 39.0–52.0)
Hemoglobin: 14.5 g/dL (ref 13.0–17.0)
MCH: 30.8 pg (ref 26.0–34.0)
MCHC: 33.8 g/dL (ref 30.0–36.0)
MCV: 91.1 fL (ref 80.0–100.0)
Platelets: 251 10*3/uL (ref 150–400)
RBC: 4.71 MIL/uL (ref 4.22–5.81)
RDW: 13.4 % (ref 11.5–15.5)
WBC: 9.9 10*3/uL (ref 4.0–10.5)
nRBC: 0 % (ref 0.0–0.2)

## 2020-12-31 LAB — GLUCOSE, CAPILLARY: Glucose-Capillary: 139 mg/dL — ABNORMAL HIGH (ref 70–99)

## 2020-12-31 MED ORDER — MAGNESIUM HYDROXIDE 400 MG/5ML PO SUSP
30.0000 mL | Freq: Once | ORAL | Status: AC
  Administered 2020-12-31: 30 mL via ORAL

## 2020-12-31 MED ORDER — MAGNESIUM HYDROXIDE 400 MG/5ML PO SUSP
ORAL | Status: AC
  Filled 2020-12-31: qty 30

## 2020-12-31 MED ORDER — GADOBUTROL 1 MMOL/ML IV SOLN
8.0000 mL | Freq: Once | INTRAVENOUS | Status: AC | PRN
  Administered 2020-12-31: 8 mL via INTRAVENOUS

## 2020-12-31 MED ORDER — POLYETHYLENE GLYCOL 3350 17 G PO PACK
17.0000 g | PACK | Freq: Two times a day (BID) | ORAL | Status: DC
  Administered 2020-12-31: 17 g via ORAL
  Filled 2020-12-31: qty 1
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
## 2020-12-31 NOTE — Progress Notes (Signed)
   12/31/20 1615  Clinical Encounter Type  Visited With Patient and family together  Visit Type Follow-up  Referral From Chaplain  Consult/Referral To Chaplain   Chaplain Tery Sanfilippo visited the patient as his girlfriend and cousin were at his bedside. Donnajean Lopes delivered a Bible as requested. This note was prepared by Deneen Harts, M.Div..  For questions please contact by phone [phone].

## 2020-12-31 NOTE — Progress Notes (Signed)
EEG completed, results pending. 

## 2020-12-31 NOTE — Progress Notes (Signed)
Mobility Specialist Progress Note   12/31/20 1832  Mobility  Activity Dangled on edge of bed  Level of Assistance Standby assist, set-up cues, supervision of patient - no hands on  Assistive Device None  Mobility Response Tolerated well  Mobility performed by Mobility specialist  $Mobility charge 1 Mobility   Received pt sitting EOB c/o being tired but agreeable to mobility. Worked on LE exercises, pt able to cooperate in all task w/o any problem. Left on EOB w/ call bell in reach and wife in room.  Frederico Hamman Mobility Specialist Phone Number [phone]
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
## 2021-01-01 ENCOUNTER — Encounter (HOSPITAL_COMMUNITY): Payer: Self-pay | Admitting: Emergency Medicine

## 2021-01-01 ENCOUNTER — Other Ambulatory Visit (HOSPITAL_COMMUNITY): Payer: Self-pay

## 2021-01-01 LAB — GLUCOSE, CAPILLARY: Glucose-Capillary: 127 mg/dL — ABNORMAL HIGH (ref 70–99)

## 2021-01-01 MED ORDER — POLYETHYLENE GLYCOL 3350 17 G PO PACK: 17.0000 g | PACK | Freq: Every day | ORAL | Status: AC | PRN

## 2021-01-01 MED ORDER — OXYCODONE HCL 5 MG PO TABS
5.0000 mg | ORAL_TABLET | Freq: Four times a day (QID) | ORAL | 0 refills | Status: DC | PRN
  Filled 2021-01-01: qty 15, 4d supply, fill #0

## 2021-01-01 MED ORDER — DIVALPROEX SODIUM 500 MG PO DR TAB
500.0000 mg | DELAYED_RELEASE_TABLET | Freq: Two times a day (BID) | ORAL | 0 refills | Status: AC
  Filled 2021-01-01: qty 60, 30d supply, fill #0

## 2021-01-01 MED ORDER — PRAZOSIN HCL 1 MG PO CAPS
1.0000 mg | ORAL_CAPSULE | Freq: Every day | ORAL | 0 refills | Status: AC
  Filled 2021-01-01: qty 30, 30d supply, fill #0

## 2021-01-01 MED ORDER — METHOCARBAMOL 500 MG PO TABS
500.0000 mg | ORAL_TABLET | Freq: Three times a day (TID) | ORAL | 0 refills | Status: AC | PRN
  Filled 2021-01-01: qty 60, 20d supply, fill #0

## 2021-01-01 MED ORDER — DOCUSATE SODIUM 100 MG PO CAPS: 100.0000 mg | ORAL_CAPSULE | Freq: Every day | ORAL | Status: AC | PRN

## 2021-01-01 MED ORDER — GABAPENTIN 300 MG PO CAPS
300.0000 mg | ORAL_CAPSULE | Freq: Three times a day (TID) | ORAL | 0 refills | Status: AC
  Filled 2021-01-01: qty 90, 30d supply, fill #0

## 2021-01-01 MED ORDER — HYDROXYZINE HCL 10 MG PO TABS
10.0000 mg | ORAL_TABLET | Freq: Three times a day (TID) | ORAL | 0 refills | Status: AC | PRN
Start: 2021-01-01 — End: ?
  Filled 2021-01-01: qty 30, 10d supply, fill #0

## 2021-01-01 NOTE — Progress Notes (Signed)
Mobility Specialist Progress Note:   01/01/21 1030  Mobility  Activity Ambulated in hall  Level of Assistance Standby assist, set-up cues, supervision of patient - no hands on  Assistive Device Front wheel walker  Distance Ambulated (ft) 230 ft  Mobility Ambulated independently in hallway  Mobility Response Tolerated well  Mobility performed by Mobility specialist  $Mobility charge 1 Mobility   Pt still having "nerve spasms" while ambulating. States they have significantly improved since admission. Pt otherwise asx. Eager for d/c.  Addison Lank Mobility Specialist  Phone [phone]
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
## 2021-01-01 NOTE — Progress Notes (Signed)
Nursing dc note  Patient alert and oriented. Both patient and wife verbalized undrstanding of dc instructions. All belongings given to patient.

## 2021-01-08 ENCOUNTER — Inpatient Hospital Stay: Payer: Self-pay | Admitting: Physician Assistant

## 2021-01-09 ENCOUNTER — Other Ambulatory Visit: Payer: Self-pay | Admitting: Physician Assistant

## 2021-01-09 ENCOUNTER — Other Ambulatory Visit: Payer: Self-pay

## 2021-01-09 ENCOUNTER — Encounter: Payer: Self-pay | Admitting: Physician Assistant

## 2021-01-09 ENCOUNTER — Ambulatory Visit: Payer: Self-pay | Admitting: Physician Assistant

## 2021-01-09 MED ORDER — TRAMADOL HCL 50 MG PO TABS: 50.0000 mg | ORAL_TABLET | Freq: Three times a day (TID) | ORAL | 0 refills | Status: AC | PRN

## 2021-01-09 NOTE — Progress Notes (Signed)
Hospital chart from hospitalization reviewed, [place] controlled substance registry appropriate, patient to follow-up with mobile unit for hospital follow-up on Monday, January 13, 2021.  Patient unable to complete hospital follow-up at [facility] due to provider illness.  Roney Jaffe, PA-C Physician Assistant [facility] [email or web address]

## 2021-01-15 ENCOUNTER — Other Ambulatory Visit: Payer: Self-pay

## 2021-01-15 ENCOUNTER — Ambulatory Visit: Payer: Self-pay | Attending: Physician Assistant | Admitting: Physical Therapy

## 2021-01-15 ENCOUNTER — Ambulatory Visit: Payer: Self-pay | Admitting: Occupational Therapy

## 2021-01-15 ENCOUNTER — Encounter: Payer: Self-pay | Admitting: Occupational Therapy

## 2021-01-15 ENCOUNTER — Encounter: Payer: Self-pay | Admitting: Physical Therapy

## 2021-01-15 DIAGNOSIS — R262 Difficulty in walking, not elsewhere classified: Secondary | ICD-10-CM | POA: Insufficient documentation

## 2021-01-15 DIAGNOSIS — R278 Other lack of coordination: Secondary | ICD-10-CM | POA: Insufficient documentation

## 2021-01-15 DIAGNOSIS — R41841 Cognitive communication deficit: Secondary | ICD-10-CM | POA: Insufficient documentation

## 2021-01-15 DIAGNOSIS — M25611 Stiffness of right shoulder, not elsewhere classified: Secondary | ICD-10-CM | POA: Insufficient documentation

## 2021-01-15 DIAGNOSIS — R41840 Attention and concentration deficit: Secondary | ICD-10-CM | POA: Insufficient documentation

## 2021-01-15 DIAGNOSIS — R2681 Unsteadiness on feet: Secondary | ICD-10-CM

## 2021-01-15 DIAGNOSIS — M25612 Stiffness of left shoulder, not elsewhere classified: Secondary | ICD-10-CM

## 2021-01-15 DIAGNOSIS — Y939 Activity, unspecified: Secondary | ICD-10-CM | POA: Insufficient documentation

## 2021-01-15 DIAGNOSIS — R2689 Other abnormalities of gait and mobility: Secondary | ICD-10-CM | POA: Insufficient documentation

## 2021-01-15 DIAGNOSIS — M6281 Muscle weakness (generalized): Secondary | ICD-10-CM | POA: Insufficient documentation

## 2021-01-15 DIAGNOSIS — R208 Other disturbances of skin sensation: Secondary | ICD-10-CM | POA: Insufficient documentation

## 2021-01-15 DIAGNOSIS — X58XXXA Exposure to other specified factors, initial encounter: Secondary | ICD-10-CM | POA: Insufficient documentation

## 2021-01-15 DIAGNOSIS — Y929 Unspecified place or not applicable: Secondary | ICD-10-CM | POA: Insufficient documentation
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
## 2021-01-16 NOTE — Addendum Note (Signed)
Addended by: Drake Leach on: 01/16/2021 12:54 PM   Modules accepted: Orders

## 2021-01-20 ENCOUNTER — Other Ambulatory Visit: Payer: Self-pay

## 2021-01-20 ENCOUNTER — Ambulatory Visit: Payer: Self-pay

## 2021-01-20 DIAGNOSIS — R41841 Cognitive communication deficit: Secondary | ICD-10-CM

## 2021-01-21 ENCOUNTER — Ambulatory Visit: Payer: Self-pay

## 2021-01-21 ENCOUNTER — Encounter: Payer: Self-pay | Admitting: Occupational Therapy

## 2021-01-21 ENCOUNTER — Ambulatory Visit: Payer: Self-pay | Admitting: Occupational Therapy

## 2021-01-21 DIAGNOSIS — R278 Other lack of coordination: Secondary | ICD-10-CM

## 2021-01-21 DIAGNOSIS — R2681 Unsteadiness on feet: Secondary | ICD-10-CM

## 2021-01-21 DIAGNOSIS — R208 Other disturbances of skin sensation: Secondary | ICD-10-CM

## 2021-01-21 DIAGNOSIS — M25612 Stiffness of left shoulder, not elsewhere classified: Secondary | ICD-10-CM

## 2021-01-21 DIAGNOSIS — M6281 Muscle weakness (generalized): Secondary | ICD-10-CM

## 2021-01-21 DIAGNOSIS — M25611 Stiffness of right shoulder, not elsewhere classified: Secondary | ICD-10-CM

## 2021-01-21 DIAGNOSIS — R41840 Attention and concentration deficit: Secondary | ICD-10-CM
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
## 2021-01-23 ENCOUNTER — Encounter: Payer: Self-pay | Admitting: Occupational Therapy

## 2021-01-23 ENCOUNTER — Ambulatory Visit: Payer: Self-pay | Admitting: Occupational Therapy

## 2021-01-23 ENCOUNTER — Other Ambulatory Visit: Payer: Self-pay

## 2021-01-23 DIAGNOSIS — R208 Other disturbances of skin sensation: Secondary | ICD-10-CM

## 2021-01-23 DIAGNOSIS — M6281 Muscle weakness (generalized): Secondary | ICD-10-CM

## 2021-01-23 DIAGNOSIS — M25611 Stiffness of right shoulder, not elsewhere classified: Secondary | ICD-10-CM

## 2021-01-23 DIAGNOSIS — R41840 Attention and concentration deficit: Secondary | ICD-10-CM

## 2021-01-23 DIAGNOSIS — R278 Other lack of coordination: Secondary | ICD-10-CM

## 2021-01-23 DIAGNOSIS — M25612 Stiffness of left shoulder, not elsewhere classified: Secondary | ICD-10-CM

## 2021-01-23 DIAGNOSIS — R2681 Unsteadiness on feet: Secondary | ICD-10-CM
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
## 2021-01-28 ENCOUNTER — Other Ambulatory Visit: Payer: Self-pay

## 2021-01-28 ENCOUNTER — Ambulatory Visit: Payer: Self-pay | Admitting: Occupational Therapy

## 2021-01-28 ENCOUNTER — Encounter: Payer: Self-pay | Admitting: Occupational Therapy

## 2021-01-28 DIAGNOSIS — M25611 Stiffness of right shoulder, not elsewhere classified: Secondary | ICD-10-CM

## 2021-01-28 DIAGNOSIS — R2681 Unsteadiness on feet: Secondary | ICD-10-CM

## 2021-01-28 DIAGNOSIS — M6281 Muscle weakness (generalized): Secondary | ICD-10-CM

## 2021-01-28 DIAGNOSIS — M25612 Stiffness of left shoulder, not elsewhere classified: Secondary | ICD-10-CM

## 2021-01-28 DIAGNOSIS — R278 Other lack of coordination: Secondary | ICD-10-CM

## 2021-01-28 DIAGNOSIS — R41840 Attention and concentration deficit: Secondary | ICD-10-CM

## 2021-01-28 DIAGNOSIS — R208 Other disturbances of skin sensation: Secondary | ICD-10-CM
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
## 2021-01-30 ENCOUNTER — Encounter: Payer: Self-pay | Admitting: Occupational Therapy

## 2021-02-05 ENCOUNTER — Encounter: Payer: Self-pay | Admitting: Occupational Therapy

## 2021-02-11 ENCOUNTER — Ambulatory Visit: Payer: Self-pay | Attending: Physician Assistant | Admitting: Physical Therapy

## 2021-02-12 ENCOUNTER — Encounter: Payer: Self-pay | Admitting: Occupational Therapy

## 2021-02-18 ENCOUNTER — Ambulatory Visit: Payer: Self-pay | Admitting: Physical Therapy

## 2021-02-19 ENCOUNTER — Encounter: Payer: Self-pay | Admitting: Occupational Therapy

## 2021-02-25 ENCOUNTER — Telehealth: Payer: Self-pay | Admitting: Physical Therapy

## 2021-02-25 ENCOUNTER — Ambulatory Visit: Payer: Self-pay | Admitting: Physical Therapy

## 2021-02-25 NOTE — Telephone Encounter (Signed)
Called pt's gf regarding no show appointment today. Had to leave a voicemail. Pt previously had a no show and cancelled last week.  Educated regarding no show policy and that if pt does not show at the next visit will have to cancel remainder of pt's visits.   Gave clinic phone number to call back if there are any questions.  Sherlie Ban, PT, DPT 02/25/21 4:43 PM    [facility] [address] [place], [place]  [address] Phone:  [phone] Fax:  [phone]

## 2021-02-26 ENCOUNTER — Encounter: Payer: Self-pay | Admitting: Occupational Therapy

## 2021-03-04 ENCOUNTER — Ambulatory Visit: Payer: Self-pay | Admitting: Physical Therapy

## 2021-03-05 ENCOUNTER — Ambulatory Visit: Payer: Self-pay | Attending: Physician Assistant | Admitting: Physical Therapy

## 2021-03-06 ENCOUNTER — Encounter: Payer: Self-pay | Admitting: Occupational Therapy

## 2021-03-10 ENCOUNTER — Encounter: Payer: Self-pay | Admitting: Occupational Therapy

## 2021-03-11 ENCOUNTER — Ambulatory Visit: Payer: Self-pay | Admitting: Physical Therapy

## 2021-03-12 ENCOUNTER — Ambulatory Visit: Payer: Self-pay | Admitting: Physical Therapy

## 2021-03-13 ENCOUNTER — Encounter: Payer: Self-pay | Admitting: Occupational Therapy

## 2021-03-17 ENCOUNTER — Encounter: Payer: Self-pay | Admitting: Occupational Therapy

## 2021-03-18 ENCOUNTER — Ambulatory Visit: Payer: Self-pay

## 2021-03-19 ENCOUNTER — Ambulatory Visit: Payer: Self-pay | Admitting: Physical Therapy

## 2021-03-24 ENCOUNTER — Encounter: Payer: Self-pay | Admitting: Occupational Therapy

## 2021-03-25 ENCOUNTER — Ambulatory Visit: Payer: Self-pay | Admitting: Physical Therapy

## 2021-03-26 ENCOUNTER — Encounter: Payer: Self-pay | Admitting: Occupational Therapy

## 2021-03-27 ENCOUNTER — Ambulatory Visit: Payer: Self-pay

## 2021-03-31 ENCOUNTER — Encounter: Payer: Self-pay | Admitting: Occupational Therapy

## 2021-04-01 ENCOUNTER — Ambulatory Visit: Payer: Self-pay | Admitting: Physical Therapy

## 2021-04-02 ENCOUNTER — Encounter: Payer: Self-pay | Admitting: Occupational Therapy

## 2021-04-03 ENCOUNTER — Ambulatory Visit: Payer: Self-pay

## 2021-04-08 ENCOUNTER — Ambulatory Visit: Payer: Self-pay | Admitting: Physical Therapy

## 2021-04-15 ENCOUNTER — Ambulatory Visit: Payer: Self-pay | Admitting: Physical Therapy

## 2021-04-17 ENCOUNTER — Ambulatory Visit: Payer: Self-pay

## 2021-06-08 ENCOUNTER — Emergency Department (HOSPITAL_COMMUNITY)
Admission: EM | Admit: 2021-06-08 | Discharge: 2021-06-08 | Disposition: A | Discharge status: Home / Self Care | Payer: 59 | Attending: Student | Admitting: Student

## 2021-06-08 DIAGNOSIS — E162 Hypoglycemia, unspecified: Secondary | ICD-10-CM | POA: Diagnosis present

## 2021-06-08 DIAGNOSIS — F17210 Nicotine dependence, cigarettes, uncomplicated: Secondary | ICD-10-CM | POA: Diagnosis not present

## 2021-06-08 DIAGNOSIS — E10649 Type 1 diabetes mellitus with hypoglycemia without coma: Secondary | ICD-10-CM | POA: Diagnosis not present

## 2021-06-08 LAB — CBG MONITORING, ED
Glucose-Capillary: 187 mg/dL — ABNORMAL HIGH (ref 70–99)
Glucose-Capillary: 54 mg/dL — ABNORMAL LOW (ref 70–99)
Glucose-Capillary: 116 mg/dL — ABNORMAL HIGH (ref 70–99)

## 2021-06-08 LAB — CBC WITH DIFFERENTIAL/PLATELET
Abs Immature Granulocytes: 0.04 10*3/uL (ref 0.00–0.07)
Basophils Absolute: 0.1 10*3/uL (ref 0.0–0.1)
Basophils Relative: 1 %
Eosinophils Absolute: 0.4 10*3/uL (ref 0.0–0.5)
Eosinophils Relative: 3 %
HCT: 49.5 % (ref 39.0–52.0)
Hemoglobin: 16.8 g/dL (ref 13.0–17.0)
Immature Granulocytes: 0 %
Lymphocytes Relative: 34 %
Lymphs Abs: 4.2 10*3/uL — ABNORMAL HIGH (ref 0.7–4.0)
MCH: 31.1 pg (ref 26.0–34.0)
MCHC: 33.9 g/dL (ref 30.0–36.0)
MCV: 91.5 fL (ref 80.0–100.0)
Monocytes Absolute: 1 10*3/uL (ref 0.1–1.0)
Monocytes Relative: 8 %
Neutro Abs: 6.7 10*3/uL (ref 1.7–7.7)
Neutrophils Relative %: 54 %
Platelets: 247 10*3/uL (ref 150–400)
RBC: 5.41 MIL/uL (ref 4.22–5.81)
RDW: 13.7 % (ref 11.5–15.5)
WBC: 12.3 10*3/uL — ABNORMAL HIGH (ref 4.0–10.5)
nRBC: 0 % (ref 0.0–0.2)

## 2021-06-08 LAB — COMPREHENSIVE METABOLIC PANEL
ALT: 22 U/L (ref 0–44)
AST: 21 U/L (ref 15–41)
Albumin: 4.6 g/dL (ref 3.5–5.0)
Alkaline Phosphatase: 44 U/L (ref 38–126)
Anion gap: 10 (ref 5–15)
BUN: 11 mg/dL (ref 6–20)
CO2: 22 mmol/L (ref 22–32)
Calcium: 8.7 mg/dL — ABNORMAL LOW (ref 8.9–10.3)
Chloride: 110 mmol/L (ref 98–111)
Creatinine, Ser: 1.03 mg/dL (ref 0.61–1.24)
GFR, Estimated: >60 mL/min (ref >60)
Glucose, Bld: 63 mg/dL — ABNORMAL LOW (ref 70–99)
Potassium: 3.5 mmol/L (ref 3.5–5.1)
Sodium: 142 mmol/L (ref 135–145)
Total Bilirubin: 1.4 mg/dL — ABNORMAL HIGH (ref 0.3–1.2)
Total Protein: 7.7 g/dL (ref 6.5–8.1)

## 2021-06-08 LAB — ETHANOL: Alcohol, Ethyl (B): 37 mg/dL — ABNORMAL HIGH (ref <10)

## 2021-06-08 MED ORDER — DEXTROSE 50 % IV SOLN
1.0000 | Freq: Once | INTRAVENOUS | Status: AC
  Administered 2021-06-08: 50 mL via INTRAVENOUS
  Filled 2021-06-08: qty 50

## 2021-06-08 MED ORDER — LEVETIRACETAM 500 MG PO TABS: 500.0000 mg | ORAL_TABLET | Freq: Two times a day (BID) | ORAL | 0 refills | Status: AC

## 2021-06-08 NOTE — ED Triage Notes (Signed)
Family states pt told her his blood sugar was low. Pt did not check blood sugar at home. Family states pt has not been taking medications for diabetes or seizures. Blood sugar 54 in triage.  ?

## 2021-06-08 NOTE — ED Notes (Signed)
Provided w/ sandwich and more OJ.  ?
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
## 2021-10-10 ENCOUNTER — Emergency Department (HOSPITAL_COMMUNITY)
Admission: EM | Admit: 2021-10-10 | Discharge: 2021-10-11 | Disposition: A | Discharge status: Home / Self Care | Payer: Self-pay | Attending: Emergency Medicine | Admitting: Emergency Medicine

## 2021-10-10 ENCOUNTER — Other Ambulatory Visit: Payer: Self-pay

## 2021-10-10 ENCOUNTER — Encounter (HOSPITAL_COMMUNITY): Payer: Self-pay

## 2021-10-10 ENCOUNTER — Emergency Department (HOSPITAL_COMMUNITY): Payer: Self-pay

## 2021-10-10 DIAGNOSIS — E119 Type 2 diabetes mellitus without complications: Secondary | ICD-10-CM | POA: Insufficient documentation

## 2021-10-10 DIAGNOSIS — S61211A Laceration without foreign body of left index finger without damage to nail, initial encounter: Secondary | ICD-10-CM | POA: Insufficient documentation

## 2021-10-10 DIAGNOSIS — K0889 Other specified disorders of teeth and supporting structures: Secondary | ICD-10-CM | POA: Insufficient documentation

## 2021-10-10 DIAGNOSIS — Y92002 Bathroom of unspecified non-institutional (private) residence single-family (private) house as the place of occurrence of the external cause: Secondary | ICD-10-CM | POA: Insufficient documentation

## 2021-10-10 DIAGNOSIS — W268XXA Contact with other sharp object(s), not elsewhere classified, initial encounter: Secondary | ICD-10-CM | POA: Insufficient documentation

## 2021-10-10 DIAGNOSIS — Z23 Encounter for immunization: Secondary | ICD-10-CM | POA: Insufficient documentation

## 2021-10-10 LAB — CBG MONITORING, ED: Glucose-Capillary: 82 mg/dL (ref 70–99)

## 2021-10-10 MED ORDER — LIDOCAINE HCL (PF) 1 % IJ SOLN
10.0000 mL | Freq: Once | INTRAMUSCULAR | Status: AC
  Administered 2021-10-10: 10 mL
  Filled 2021-10-10: qty 30

## 2021-10-10 MED ORDER — CLINDAMYCIN HCL 300 MG PO CAPS
450.0000 mg | ORAL_CAPSULE | Freq: Once | ORAL | Status: AC
  Administered 2021-10-10: 450 mg via ORAL
  Filled 2021-10-10: qty 1

## 2021-10-10 MED ORDER — OXYCODONE HCL 5 MG PO TABS
5.0000 mg | ORAL_TABLET | Freq: Once | ORAL | Status: AC
  Administered 2021-10-10: 5 mg via ORAL
  Filled 2021-10-10: qty 1

## 2021-10-10 MED ORDER — BUPIVACAINE HCL 0.25 % IJ SOLN
10.0000 mL | Freq: Once | INTRAMUSCULAR | Status: DC
  Filled 2021-10-10: qty 10

## 2021-10-10 MED ORDER — TETANUS-DIPHTH-ACELL PERTUSSIS 5-2.5-18.5 LF-MCG/0.5 IM SUSY
0.5000 mL | PREFILLED_SYRINGE | Freq: Once | INTRAMUSCULAR | Status: AC
  Administered 2021-10-10: 0.5 mL via INTRAMUSCULAR
  Filled 2021-10-10: qty 0.5

## 2021-10-10 MED ORDER — CLINDAMYCIN HCL 150 MG PO CAPS: 450.0000 mg | ORAL_CAPSULE | Freq: Three times a day (TID) | ORAL | 0 refills | Status: AC

## 2021-10-10 NOTE — ED Triage Notes (Signed)
Pt cut his left first finger with a box cutter while doing work in his bathroom today. Pt states that he cut it to the bone.
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
## 2021-10-10 NOTE — ED Provider Notes (Signed)
[facility]-EMERGENCY DEPT Provider Note   CSN: [id number] Arrival date & time: 10/10/21  2130     History {Add pertinent medical, surgical, social history, OB history to HPI:1} Chief Complaint  Patient presents with   Laceration    Jonathon Bird is a 41 y.o. male.   Laceration      Home Medications Prior to Admission medications   Medication Sig Start Date End Date Taking? Authorizing Provider  acetaminophen (TYLENOL) 500 MG tablet Take 1,000 mg by mouth every 6 (six) hours as needed for mild pain or headache.    [provider]  divalproex (DEPAKOTE) 500 MG DR tablet Take 1 tablet (500 mg total) by mouth every 12 (twelve) hours. 01/01/21 01/31/21  Juliet Rude, PA-C  docusate sodium (COLACE) 100 MG capsule Take 1 capsule (100 mg total) by mouth daily as needed for mild constipation. Patient not taking: Reported on 01/15/2021 01/01/21   Juliet Rude, PA-C  gabapentin (NEURONTIN) 300 MG capsule Take 1 capsule (300 mg total) by mouth 3 (three) times daily. 01/01/21   Juliet Rude, PA-C  hydrOXYzine (ATARAX) 10 MG tablet Take 1 tablet (10 mg total) by mouth 3 (three) times daily as needed for anxiety. 01/01/21   Juliet Rude, PA-C  ibuprofen (ADVIL) 800 MG tablet Take 1 tablet (800 mg total) by mouth 3 (three) times daily. 06/17/20   Gilda Crease, MD  levETIRAcetam (KEPPRA) 500 MG tablet Take 1 tablet (500 mg total) by mouth 2 (two) times daily. 06/08/21 07/08/21  Kommor, [place], MD  methocarbamol (ROBAXIN) 500 MG tablet Take 1 tablet (500 mg total) by mouth every 8 (eight) hours as needed for muscle spasms. 06/17/20   Gilda Crease, MD  methocarbamol (ROBAXIN) 500 MG tablet Take 1 tablet (500 mg total) by mouth every 8 (eight) hours as needed for muscle spasms. 01/01/21   Juliet Rude, PA-C  polyethylene glycol (MIRALAX / GLYCOLAX) 17 g packet Take 17 g by mouth daily as needed for mild constipation. Patient  not taking: Reported on 01/15/2021 01/01/21   Juliet Rude, PA-C  prazosin (MINIPRESS) 1 MG capsule Take 1 capsule (1 mg total) by mouth at bedtime. 01/01/21   Juliet Rude, PA-C      Allergies    Penicillins    Review of Systems   Review of Systems  Physical Exam Updated Vital Signs BP (!) 132/92 (BP Location: Left Arm)   Pulse 70   Temp 99 F (37.2 C) (Oral)   Resp 14   Ht 5\' 7"  (1.702 m)   Wt 83 kg   SpO2 99%   BMI 28.66 kg/m  Physical Exam  ED Results / Procedures / Treatments   Labs (all labs ordered are listed, but only abnormal results are displayed) Labs Reviewed  CBG MONITORING, ED    EKG None  Radiology DG Finger Index Left  Result Date: 10/10/2021 CLINICAL DATA:  Laceration of the left hand. EXAM: LEFT INDEX FINGER 2+V COMPARISON:  None Available. FINDINGS: There is no acute fracture or dislocation. The bones are well mineralized. No arthritic changes. There is laceration of the skin of the distal index finger. A punctate radiopaque, possibly calcified focus in the soft tissues of the volar aspect of the distal fourth digit, likely chronic. Clinical correlation is recommended. IMPRESSION: 1. No acute fracture or dislocation. 2. Laceration of the skin of the distal index finger. Electronically Signed   By: 12/10/2021 M.D.   On:  10/10/2021 22:07    Procedures .Marland KitchenLaceration Repair  Date/Time: 10/10/2021 11:45 PM  Performed by: Mardene Sayer, MD Authorized by: Mardene Sayer, MD   Consent:    Consent obtained:  Verbal   Consent given by:  Patient   Risks, benefits, and alternatives were discussed: yes     Risks discussed:  Infection, pain, poor cosmetic result and poor wound healing   Alternatives discussed:  No treatment Universal protocol:    Patient identity confirmed:  Verbally with patient Anesthesia:    Anesthesia method:  Local infiltration   Local anesthetic:  Lidocaine 1% w/o epi (digit block) Laceration details:     Location: left 2nd digit.   Length (cm):  2 Pre-procedure details:    Preparation:  Patient was prepped and draped in usual sterile fashion Exploration:    Hemostasis achieved with:  Tourniquet   Imaging obtained: x-ray     Imaging outcome: foreign body not noted     Contaminated: no   Treatment:    Area cleansed with:  Saline   Amount of cleaning:  Standard   Irrigation solution:  Sterile saline   Irrigation volume:  500   Visualized foreign bodies/material removed: no   Skin repair:    Repair method:  Sutures   Suture size:  5-0   Suture material:  Fast-absorbing gut   Suture technique:  Simple interrupted   Number of sutures:  5 Repair type:    Repair type:  Simple Post-procedure details:    Dressing: xeroform and dressing.   Procedure completion:  Tolerated well, no immediate complications   {Document cardiac monitor, telemetry assessment procedure when appropriate:1}  Medications Ordered in ED Medications  Tdap (BOOSTRIX) injection 0.5 mL (has no administration in time range)  bupivacaine (MARCAINE) 0.25 % (with pres) injection 10 mL (has no administration in time range)    ED Course/ Medical Decision Making/ A&P                           Medical Decision Making  ***  {Document critical care time when appropriate:1} {Document review of labs and clinical decision tools ie heart score, Chads2Vasc2 etc:1}  {Document your independent review of radiology images, and any outside records:1} {Document your discussion with family members, caretakers, and with consultants:1} {Document social determinants of health affecting pt's care:1} {Document your decision making why or why not admission, treatments were needed:1} Final Clinical Impression(s) / ED Diagnoses Final diagnoses:  None    Rx / DC Orders ED Discharge Orders     None

## 2021-10-10 NOTE — ED Provider Triage Note (Signed)
Emergency Medicine Provider Triage Evaluation Note  Justus Droke , a 41 y.o. male  was evaluated in triage.  Pt complains of laceration to left index finger.  He cut himself with a box cutter while taking a tile.  Unsure his last tetanus, denies any paresthesias.  Review of Systems  Per HPI  Physical Exam  BP (!) 132/92 (BP Location: Left Arm)   Pulse 70   Temp 99 F (37.2 C) (Oral)   Resp 14   Ht 5\' 7"  (1.702 m)   Wt 83 kg   SpO2 99%   BMI 28.66 kg/m  Gen:   Awake, no distress   Resp:  Normal effort  MSK:   Moves extremities without difficulty  Other:  Laceration to index finger distally.  I do not visualize bone or tendon, he is able to flex extend at the DIP and PIP.  Radial pulses 2+, cap refills less than 2  Medical Decision Making  Medically screening exam initiated at 9:45 PM.  Appropriate orders placed.  Islam Johnluke Haugen was informed that the remainder of the evaluation will be completed by another provider, this initial triage assessment does not replace that evaluation, and the importance of remaining in the ED until their evaluation is complete.     Lilli Few, PA-C 10/10/21 2146

## 2022-05-21 ENCOUNTER — Emergency Department (HOSPITAL_COMMUNITY): Payer: Self-pay

## 2022-05-21 ENCOUNTER — Emergency Department (HOSPITAL_COMMUNITY)
Admission: EM | Admit: 2022-05-21 | Discharge: 2022-05-21 | Disposition: A | Discharge status: Home / Self Care | Payer: Self-pay | Attending: Emergency Medicine | Admitting: Emergency Medicine

## 2022-05-21 ENCOUNTER — Encounter (HOSPITAL_COMMUNITY): Payer: Self-pay

## 2022-05-21 ENCOUNTER — Other Ambulatory Visit: Payer: Self-pay

## 2022-05-21 DIAGNOSIS — E109 Type 1 diabetes mellitus without complications: Secondary | ICD-10-CM | POA: Insufficient documentation

## 2022-05-21 DIAGNOSIS — R001 Bradycardia, unspecified: Secondary | ICD-10-CM | POA: Insufficient documentation

## 2022-05-21 DIAGNOSIS — W01198A Fall on same level from slipping, tripping and stumbling with subsequent striking against other object, initial encounter: Secondary | ICD-10-CM | POA: Insufficient documentation

## 2022-05-21 DIAGNOSIS — R55 Syncope and collapse: Secondary | ICD-10-CM | POA: Insufficient documentation

## 2022-05-21 HISTORY — DX: Unspecified convulsions: R56.9

## 2022-05-21 LAB — COMPREHENSIVE METABOLIC PANEL
ALT: 19 U/L (ref 0–44)
AST: 25 U/L (ref 15–41)
Albumin: 4.1 g/dL (ref 3.5–5.0)
Alkaline Phosphatase: 40 U/L (ref 38–126)
Anion gap: 11 (ref 5–15)
BUN: 7 mg/dL (ref 6–20)
CO2: 24 mmol/L (ref 22–32)
Calcium: 9 mg/dL (ref 8.9–10.3)
Chloride: 104 mmol/L (ref 98–111)
Creatinine, Ser: 1.18 mg/dL (ref 0.61–1.24)
GFR, Estimated: >60 mL/min (ref >60)
Glucose, Bld: 75 mg/dL (ref 70–99)
Potassium: 4.3 mmol/L (ref 3.5–5.1)
Sodium: 139 mmol/L (ref 135–145)
Total Bilirubin: 1.1 mg/dL (ref 0.3–1.2)
Total Protein: 6.5 g/dL (ref 6.5–8.1)

## 2022-05-21 LAB — CBC
HCT: 46.6 % (ref 39.0–52.0)
Hemoglobin: 15.8 g/dL (ref 13.0–17.0)
MCH: 30.8 pg (ref 26.0–34.0)
MCHC: 33.9 g/dL (ref 30.0–36.0)
MCV: 90.8 fL (ref 80.0–100.0)
Platelets: 254 10*3/uL (ref 150–400)
RBC: 5.13 MIL/uL (ref 4.22–5.81)
RDW: 13.6 % (ref 11.5–15.5)
WBC: 7.8 10*3/uL (ref 4.0–10.5)
nRBC: 0 % (ref 0.0–0.2)

## 2022-05-21 LAB — I-STAT CHEM 8, ED
BUN: 8 mg/dL (ref 6–20)
Calcium, Ion: 1.11 mmol/L — ABNORMAL LOW (ref 1.15–1.40)
Chloride: 104 mmol/L (ref 98–111)
Creatinine, Ser: 1.1 mg/dL (ref 0.61–1.24)
Glucose, Bld: 76 mg/dL (ref 70–99)
HCT: 49 % (ref 39.0–52.0)
Hemoglobin: 16.7 g/dL (ref 13.0–17.0)
Potassium: 4.2 mmol/L (ref 3.5–5.1)
Sodium: 140 mmol/L (ref 135–145)
TCO2: 28 mmol/L (ref 22–32)

## 2022-05-21 LAB — ETHANOL: Alcohol, Ethyl (B): <10 mg/dL (ref <10)

## 2022-05-21 LAB — CBG MONITORING, ED
Glucose-Capillary: 86 mg/dL (ref 70–99)
Glucose-Capillary: 86 mg/dL (ref 70–99)

## 2022-05-21 LAB — URINALYSIS, ROUTINE W REFLEX MICROSCOPIC
Bilirubin Urine: NEGATIVE
Glucose, UA: NEGATIVE mg/dL
Hgb urine dipstick: NEGATIVE
Ketones, ur: NEGATIVE mg/dL
Leukocytes,Ua: NEGATIVE
Nitrite: NEGATIVE
Protein, ur: NEGATIVE mg/dL
Specific Gravity, Urine: 1.02 (ref 1.005–1.030)
pH: 6 (ref 5.0–8.0)

## 2022-05-21 LAB — CK: Total CK: 425 U/L — ABNORMAL HIGH (ref 49–397)

## 2022-05-21 MED ORDER — ACETAMINOPHEN 500 MG PO TABS
1000.0000 mg | ORAL_TABLET | Freq: Once | ORAL | Status: AC
  Administered 2022-05-21: 1000 mg via ORAL
  Filled 2022-05-21: qty 2

## 2022-05-21 MED ORDER — LACTATED RINGERS IV BOLUS
1000.0000 mL | Freq: Once | INTRAVENOUS | Status: AC
  Administered 2022-05-21: 1000 mL via INTRAVENOUS

## 2022-05-21 MED ORDER — ONDANSETRON 4 MG PO TBDP: 4.0000 mg | ORAL_TABLET | Freq: Three times a day (TID) | ORAL | 0 refills | Status: AC | PRN

## 2022-05-21 MED ORDER — ONDANSETRON HCL 4 MG/2ML IJ SOLN
4.0000 mg | Freq: Once | INTRAMUSCULAR | Status: AC
  Administered 2022-05-21: 4 mg via INTRAVENOUS
  Filled 2022-05-21: qty 2

## 2022-05-21 NOTE — ED Triage Notes (Signed)
Pt was unresponsive in triage and brought back emergently to exam room. Per pt's fiance, pt was spray painting paint primer and went outside and told the fiance to call EMS. Pt went back in house and she found him laying on his abdomen. Pt told her he fell. Pt states he fell and hit his head. Pt states the reason he told his fiance to call 911 was, because he felt his glucose dropping. Pt states he was feeling dizzy. Pt is a little more awake now and talking
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
## 2022-05-21 NOTE — ED Notes (Addendum)
Pt requesting pain medication. RN notified. 

## 2022-12-12 ENCOUNTER — Other Ambulatory Visit: Payer: Self-pay

## 2022-12-12 ENCOUNTER — Emergency Department (HOSPITAL_COMMUNITY): Payer: Self-pay

## 2022-12-12 ENCOUNTER — Emergency Department (HOSPITAL_COMMUNITY)
Admission: EM | Admit: 2022-12-12 | Discharge: 2022-12-12 | Disposition: A | Discharge status: Home / Self Care | Payer: Self-pay | Attending: Emergency Medicine | Admitting: Emergency Medicine

## 2022-12-12 ENCOUNTER — Encounter (HOSPITAL_COMMUNITY): Payer: Self-pay | Admitting: Emergency Medicine

## 2022-12-12 DIAGNOSIS — E119 Type 2 diabetes mellitus without complications: Secondary | ICD-10-CM | POA: Insufficient documentation

## 2022-12-12 DIAGNOSIS — I10 Essential (primary) hypertension: Secondary | ICD-10-CM | POA: Insufficient documentation

## 2022-12-12 DIAGNOSIS — M25511 Pain in right shoulder: Secondary | ICD-10-CM | POA: Insufficient documentation

## 2022-12-12 HISTORY — DX: Essential (primary) hypertension: I10

## 2022-12-12 LAB — CBC
HCT: 51.4 % (ref 39.0–52.0)
Hemoglobin: 17.2 g/dL — ABNORMAL HIGH (ref 13.0–17.0)
MCH: 31.7 pg (ref 26.0–34.0)
MCHC: 33.5 g/dL (ref 30.0–36.0)
MCV: 94.7 fL (ref 80.0–100.0)
Platelets: 196 10*3/uL (ref 150–400)
RBC: 5.43 MIL/uL (ref 4.22–5.81)
RDW: 13.9 % (ref 11.5–15.5)
WBC: 7.1 10*3/uL (ref 4.0–10.5)
nRBC: 0 % (ref 0.0–0.2)

## 2022-12-12 LAB — BASIC METABOLIC PANEL
Anion gap: 7 (ref 5–15)
BUN: 8 mg/dL (ref 6–20)
CO2: 25 mmol/L (ref 22–32)
Calcium: 9 mg/dL (ref 8.9–10.3)
Chloride: 105 mmol/L (ref 98–111)
Creatinine, Ser: 0.98 mg/dL (ref 0.61–1.24)
GFR, Estimated: >60 mL/min (ref >60)
Glucose, Bld: 169 mg/dL — ABNORMAL HIGH (ref 70–99)
Potassium: 3.7 mmol/L (ref 3.5–5.1)
Sodium: 137 mmol/L (ref 135–145)

## 2022-12-12 LAB — TROPONIN I (HIGH SENSITIVITY)
Troponin I (High Sensitivity): 2 ng/L (ref <18)
Troponin I (High Sensitivity): 3 ng/L (ref <18)

## 2022-12-12 MED ORDER — IBUPROFEN 600 MG PO TABS: 600.0000 mg | ORAL_TABLET | Freq: Three times a day (TID) | ORAL | 0 refills | Status: AC

## 2022-12-12 MED ORDER — ACETAMINOPHEN 325 MG PO TABS
650.0000 mg | ORAL_TABLET | Freq: Four times a day (QID) | ORAL | Status: DC | PRN
  Administered 2022-12-12: 650 mg via ORAL
  Filled 2022-12-12: qty 2

## 2022-12-12 MED ORDER — IBUPROFEN 800 MG PO TABS
800.0000 mg | ORAL_TABLET | Freq: Once | ORAL | Status: AC
  Administered 2022-12-12: 800 mg via ORAL
  Filled 2022-12-12: qty 1

## 2022-12-12 MED ORDER — KETOROLAC TROMETHAMINE 30 MG/ML IJ SOLN
30.0000 mg | Freq: Once | INTRAMUSCULAR | Status: AC
  Administered 2022-12-12: 30 mg via INTRAVENOUS
  Filled 2022-12-12: qty 1
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
## 2022-12-12 NOTE — Discharge Instructions (Addendum)
Today you were seen for chest pain and right shoulder pain.  You may alternate taking Tylenol and Motrin as needed for pain.  Please follow-up with your PCP if your pain persist.  Thank you for letting us treat you today. After reviewing your labs and imaging, I feel you are safe to go home. Please follow up with your PCP in the next several days and provide them with your records from this visit. Return to the Emergency Room if pain becomes severe or symptoms worsen.

## 2022-12-25 ENCOUNTER — Telehealth: Payer: Self-pay | Admitting: *Deleted

## 2022-12-25 NOTE — Telephone Encounter (Signed)
Transition Care Management Unsuccessful Follow-up Telephone Call  Date of discharge and from where:  [facility]  Attempts:  1st Attempt  Reason for unsuccessful TCM follow-up call:  No answer/busy
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
